# Patient Record
Sex: Female | Born: 1965 | Race: White | Hispanic: No | Smoking: Current every day smoker
Health system: Southern US, Community
[De-identification: ages and names within clinical notes are randomized; demographics above are authoritative.]

## PROBLEM LIST (undated history)

## (undated) DIAGNOSIS — I10 Essential (primary) hypertension: Secondary | ICD-10-CM

## (undated) DIAGNOSIS — K219 Gastro-esophageal reflux disease without esophagitis: Secondary | ICD-10-CM

## (undated) DIAGNOSIS — C801 Malignant (primary) neoplasm, unspecified: Secondary | ICD-10-CM

## (undated) DIAGNOSIS — F319 Bipolar disorder, unspecified: Secondary | ICD-10-CM

## (undated) HISTORY — PX: LASER ABLATION: SHX1947

## (undated) HISTORY — PX: TUBAL LIGATION: SHX77

---

## 2004-05-05 ENCOUNTER — Emergency Department (HOSPITAL_COMMUNITY): Admission: EM | Admit: 2004-05-05 | Discharge: 2004-05-05 | Payer: Self-pay | Admitting: Emergency Medicine

## 2007-06-18 ENCOUNTER — Ambulatory Visit: Payer: Self-pay | Admitting: Family Medicine

## 2007-06-18 DIAGNOSIS — F319 Bipolar disorder, unspecified: Secondary | ICD-10-CM

## 2007-06-18 DIAGNOSIS — G43909 Migraine, unspecified, not intractable, without status migrainosus: Secondary | ICD-10-CM | POA: Insufficient documentation

## 2007-06-18 DIAGNOSIS — T7491XA Unspecified adult maltreatment, confirmed, initial encounter: Secondary | ICD-10-CM | POA: Insufficient documentation

## 2007-06-18 DIAGNOSIS — I10 Essential (primary) hypertension: Secondary | ICD-10-CM | POA: Insufficient documentation

## 2007-06-18 DIAGNOSIS — F17200 Nicotine dependence, unspecified, uncomplicated: Secondary | ICD-10-CM

## 2007-06-18 DIAGNOSIS — N318 Other neuromuscular dysfunction of bladder: Secondary | ICD-10-CM

## 2007-06-18 LAB — CONVERTED CEMR LAB: Pap Smear: NORMAL

## 2007-07-14 ENCOUNTER — Encounter (INDEPENDENT_AMBULATORY_CARE_PROVIDER_SITE_OTHER): Payer: Self-pay | Admitting: Family Medicine

## 2007-07-29 ENCOUNTER — Encounter (INDEPENDENT_AMBULATORY_CARE_PROVIDER_SITE_OTHER): Payer: Self-pay | Admitting: Family Medicine

## 2007-07-30 LAB — CONVERTED CEMR LAB
ALT: 19 units/L (ref 0–35)
AST: 18 units/L (ref 0–37)
Albumin: 4.6 g/dL (ref 3.5–5.2)
Alkaline Phosphatase: 94 units/L (ref 39–117)
BUN: 13 mg/dL (ref 6–23)
Basophils Absolute: 0 10*3/uL (ref 0.0–0.1)
Basophils Relative: 0 % (ref 0–1)
CO2: 19 meq/L (ref 19–32)
Calcium: 8.8 mg/dL (ref 8.4–10.5)
Chloride: 106 meq/L (ref 96–112)
Cholesterol: 178 mg/dL (ref 0–200)
Creatinine, Ser: 0.88 mg/dL (ref 0.40–1.20)
Eosinophils Absolute: 0.1 10*3/uL (ref 0.0–0.7)
Eosinophils Relative: 1 % (ref 0–5)
Glucose, Bld: 91 mg/dL (ref 70–99)
HCT: 39.8 % (ref 36.0–46.0)
HDL: 58 mg/dL (ref >39)
Hemoglobin: 13.8 g/dL (ref 12.0–15.0)
LDL Cholesterol: 84 mg/dL (ref 0–99)
Lymphocytes Relative: 36 % (ref 12–46)
Lymphs Abs: 2.8 10*3/uL (ref 0.7–3.3)
MCHC: 34.7 g/dL (ref 30.0–36.0)
MCV: 89.6 fL (ref 78.0–100.0)
Monocytes Absolute: 0.7 10*3/uL (ref 0.2–0.7)
Monocytes Relative: 9 % (ref 3–11)
Neutro Abs: 4.3 10*3/uL (ref 1.7–7.7)
Neutrophils Relative %: 54 % (ref 43–77)
Platelets: 342 10*3/uL (ref 150–400)
Potassium: 4.3 meq/L (ref 3.5–5.3)
RBC: 4.44 M/uL (ref 3.87–5.11)
RDW: 13.5 % (ref 11.5–14.0)
Sodium: 139 meq/L (ref 135–145)
TSH: 1.664 microintl units/mL (ref 0.350–5.50)
Total Bilirubin: 0.6 mg/dL (ref 0.3–1.2)
Total CHOL/HDL Ratio: 3.1
Total Protein: 7.5 g/dL (ref 6.0–8.3)
Triglycerides: 179 mg/dL — ABNORMAL HIGH (ref <150)
VLDL: 36 mg/dL (ref 0–40)
WBC: 7.9 10*3/uL (ref 4.0–10.5)

## 2007-07-31 ENCOUNTER — Telehealth (INDEPENDENT_AMBULATORY_CARE_PROVIDER_SITE_OTHER): Payer: Self-pay | Admitting: *Deleted

## 2007-08-03 ENCOUNTER — Encounter (INDEPENDENT_AMBULATORY_CARE_PROVIDER_SITE_OTHER): Payer: Self-pay | Admitting: Family Medicine

## 2007-08-03 ENCOUNTER — Telehealth (INDEPENDENT_AMBULATORY_CARE_PROVIDER_SITE_OTHER): Payer: Self-pay | Admitting: *Deleted

## 2007-08-03 ENCOUNTER — Ambulatory Visit: Payer: Self-pay | Admitting: Family Medicine

## 2007-08-03 DIAGNOSIS — K219 Gastro-esophageal reflux disease without esophagitis: Secondary | ICD-10-CM

## 2007-08-04 ENCOUNTER — Other Ambulatory Visit: Admission: RE | Admit: 2007-08-04 | Discharge: 2007-08-04 | Payer: Self-pay | Admitting: Family Medicine

## 2007-08-04 ENCOUNTER — Telehealth (INDEPENDENT_AMBULATORY_CARE_PROVIDER_SITE_OTHER): Payer: Self-pay | Admitting: Family Medicine

## 2007-08-04 LAB — CONVERTED CEMR LAB: Prolactin: 4.2 ng/mL

## 2007-08-07 ENCOUNTER — Telehealth (INDEPENDENT_AMBULATORY_CARE_PROVIDER_SITE_OTHER): Payer: Self-pay | Admitting: *Deleted

## 2007-08-11 ENCOUNTER — Encounter (INDEPENDENT_AMBULATORY_CARE_PROVIDER_SITE_OTHER): Payer: Self-pay | Admitting: Family Medicine

## 2007-08-17 ENCOUNTER — Telehealth (INDEPENDENT_AMBULATORY_CARE_PROVIDER_SITE_OTHER): Payer: Self-pay | Admitting: *Deleted

## 2007-09-03 ENCOUNTER — Encounter (INDEPENDENT_AMBULATORY_CARE_PROVIDER_SITE_OTHER): Payer: Self-pay | Admitting: Family Medicine

## 2007-09-16 ENCOUNTER — Ambulatory Visit: Payer: Self-pay | Admitting: Family Medicine

## 2007-09-16 ENCOUNTER — Telehealth (INDEPENDENT_AMBULATORY_CARE_PROVIDER_SITE_OTHER): Payer: Self-pay | Admitting: *Deleted

## 2007-09-17 ENCOUNTER — Encounter (INDEPENDENT_AMBULATORY_CARE_PROVIDER_SITE_OTHER): Payer: Self-pay | Admitting: Family Medicine

## 2007-09-17 ENCOUNTER — Telehealth (INDEPENDENT_AMBULATORY_CARE_PROVIDER_SITE_OTHER): Payer: Self-pay | Admitting: *Deleted

## 2007-09-17 LAB — CONVERTED CEMR LAB
Amphetamine Screen, Ur: NEGATIVE
Barbiturate Quant, Ur: NEGATIVE
Benzodiazepines.: NEGATIVE
Cocaine Metabolites: NEGATIVE
Creatinine,U: 75.3 mg/dL
Marijuana Metabolite: NEGATIVE
Methadone: NEGATIVE
Opiate Screen, Urine: NEGATIVE
Phencyclidine (PCP): NEGATIVE
Propoxyphene: NEGATIVE

## 2007-11-26 ENCOUNTER — Telehealth (INDEPENDENT_AMBULATORY_CARE_PROVIDER_SITE_OTHER): Payer: Self-pay | Admitting: *Deleted

## 2007-11-26 ENCOUNTER — Ambulatory Visit: Payer: Self-pay | Admitting: Family Medicine

## 2008-03-14 ENCOUNTER — Encounter (INDEPENDENT_AMBULATORY_CARE_PROVIDER_SITE_OTHER): Payer: Self-pay | Admitting: Family Medicine

## 2008-05-18 ENCOUNTER — Ambulatory Visit: Payer: Self-pay | Admitting: Family Medicine

## 2008-05-18 LAB — CONVERTED CEMR LAB
Cholesterol, target level: 200 mg/dL
Glucose, Urine, Semiquant: NEGATIVE
HDL goal, serum: 40 mg/dL
Ketones, urine, test strip: NEGATIVE
LDL Goal: 130 mg/dL
Nitrite: NEGATIVE
Protein, U semiquant: 30
Specific Gravity, Urine: 1.025
Urobilinogen, UA: 0.2
pH: 5.5

## 2008-05-19 ENCOUNTER — Encounter (INDEPENDENT_AMBULATORY_CARE_PROVIDER_SITE_OTHER): Payer: Self-pay | Admitting: Family Medicine

## 2008-05-23 LAB — CONVERTED CEMR LAB
BUN: 14 mg/dL (ref 6–23)
Basophils Absolute: 0 10*3/uL (ref 0.0–0.1)
Basophils Relative: 0 % (ref 0–1)
CO2: 18 meq/L — ABNORMAL LOW (ref 19–32)
Calcium: 9.1 mg/dL (ref 8.4–10.5)
Chloride: 102 meq/L (ref 96–112)
Creatinine, Ser: 0.92 mg/dL (ref 0.40–1.20)
Eosinophils Absolute: 0.1 10*3/uL (ref 0.0–0.7)
Eosinophils Relative: 1 % (ref 0–5)
Glucose, Bld: 111 mg/dL — ABNORMAL HIGH (ref 70–99)
HCT: 41 % (ref 36.0–46.0)
Hemoglobin: 13.4 g/dL (ref 12.0–15.0)
Lymphocytes Relative: 17 % (ref 12–46)
Lymphs Abs: 1.5 10*3/uL (ref 0.7–4.0)
MCHC: 32.7 g/dL (ref 30.0–36.0)
MCV: 93.4 fL (ref 78.0–100.0)
Monocytes Absolute: 0.8 10*3/uL (ref 0.1–1.0)
Monocytes Relative: 8 % (ref 3–12)
Neutro Abs: 6.7 10*3/uL (ref 1.7–7.7)
Neutrophils Relative %: 74 % (ref 43–77)
Platelets: 425 10*3/uL — ABNORMAL HIGH (ref 150–400)
Potassium: 4.2 meq/L (ref 3.5–5.3)
RBC: 4.39 M/uL (ref 3.87–5.11)
RDW: 15.6 % — ABNORMAL HIGH (ref 11.5–15.5)
Sodium: 137 meq/L (ref 135–145)
WBC: 9.1 10*3/uL (ref 4.0–10.5)

## 2008-06-03 ENCOUNTER — Encounter (INDEPENDENT_AMBULATORY_CARE_PROVIDER_SITE_OTHER): Payer: Self-pay | Admitting: Family Medicine

## 2008-07-14 ENCOUNTER — Telehealth (INDEPENDENT_AMBULATORY_CARE_PROVIDER_SITE_OTHER): Payer: Self-pay | Admitting: *Deleted

## 2008-07-19 ENCOUNTER — Other Ambulatory Visit: Admission: RE | Admit: 2008-07-19 | Discharge: 2008-07-19 | Payer: Self-pay | Admitting: Obstetrics and Gynecology

## 2008-07-25 ENCOUNTER — Ambulatory Visit (HOSPITAL_COMMUNITY): Admission: RE | Admit: 2008-07-25 | Discharge: 2008-07-25 | Payer: Self-pay | Admitting: Obstetrics and Gynecology

## 2008-07-25 LAB — CONVERTED CEMR LAB: Pap Smear: NORMAL

## 2008-08-29 ENCOUNTER — Ambulatory Visit (HOSPITAL_COMMUNITY): Admission: RE | Admit: 2008-08-29 | Discharge: 2008-08-29 | Payer: Self-pay | Admitting: Obstetrics and Gynecology

## 2008-09-12 ENCOUNTER — Ambulatory Visit: Payer: Self-pay | Admitting: Family Medicine

## 2008-09-12 DIAGNOSIS — E785 Hyperlipidemia, unspecified: Secondary | ICD-10-CM

## 2008-09-12 LAB — CONVERTED CEMR LAB

## 2008-10-12 ENCOUNTER — Ambulatory Visit (HOSPITAL_COMMUNITY): Admission: RE | Admit: 2008-10-12 | Discharge: 2008-10-12 | Payer: Self-pay | Admitting: Obstetrics and Gynecology

## 2008-10-17 ENCOUNTER — Telehealth (INDEPENDENT_AMBULATORY_CARE_PROVIDER_SITE_OTHER): Payer: Self-pay | Admitting: *Deleted

## 2008-10-19 ENCOUNTER — Encounter (INDEPENDENT_AMBULATORY_CARE_PROVIDER_SITE_OTHER): Payer: Self-pay | Admitting: Family Medicine

## 2008-11-02 ENCOUNTER — Telehealth (INDEPENDENT_AMBULATORY_CARE_PROVIDER_SITE_OTHER): Payer: Self-pay | Admitting: *Deleted

## 2008-11-07 ENCOUNTER — Encounter (INDEPENDENT_AMBULATORY_CARE_PROVIDER_SITE_OTHER): Payer: Self-pay | Admitting: Family Medicine

## 2008-11-08 LAB — CONVERTED CEMR LAB
ALT: 19 units/L (ref 0–35)
AST: 20 units/L (ref 0–37)
Albumin: 4.4 g/dL (ref 3.5–5.2)
Alkaline Phosphatase: 95 units/L (ref 39–117)
BUN: 20 mg/dL (ref 6–23)
Basophils Absolute: 0 10*3/uL (ref 0.0–0.1)
Basophils Relative: 0 % (ref 0–1)
CO2: 18 meq/L — ABNORMAL LOW (ref 19–32)
Calcium: 9 mg/dL (ref 8.4–10.5)
Chloride: 108 meq/L (ref 96–112)
Cholesterol: 223 mg/dL — ABNORMAL HIGH (ref 0–200)
Creatinine, Ser: 0.98 mg/dL (ref 0.40–1.20)
Eosinophils Absolute: 0.1 10*3/uL (ref 0.0–0.7)
Eosinophils Relative: 2 % (ref 0–5)
Glucose, Bld: 94 mg/dL (ref 70–99)
HCT: 39.4 % (ref 36.0–46.0)
HDL: 64 mg/dL (ref >39)
Hemoglobin: 12.4 g/dL (ref 12.0–15.0)
LDL Cholesterol: 124 mg/dL — ABNORMAL HIGH (ref 0–99)
Lymphocytes Relative: 37 % (ref 12–46)
Lymphs Abs: 2.9 10*3/uL (ref 0.7–4.0)
MCHC: 31.5 g/dL (ref 30.0–36.0)
MCV: 92.1 fL (ref 78.0–100.0)
Monocytes Absolute: 0.7 10*3/uL (ref 0.1–1.0)
Monocytes Relative: 9 % (ref 3–12)
Neutro Abs: 4.1 10*3/uL (ref 1.7–7.7)
Neutrophils Relative %: 52 % (ref 43–77)
Platelets: 311 10*3/uL (ref 150–400)
Potassium: 4.2 meq/L (ref 3.5–5.3)
RBC: 4.28 M/uL (ref 3.87–5.11)
RDW: 13.4 % (ref 11.5–15.5)
Sodium: 140 meq/L (ref 135–145)
TSH: 2.546 microintl units/mL (ref 0.350–4.50)
Total Bilirubin: 0.2 mg/dL — ABNORMAL LOW (ref 0.3–1.2)
Total CHOL/HDL Ratio: 3.5
Total Protein: 7.5 g/dL (ref 6.0–8.3)
Triglycerides: 175 mg/dL — ABNORMAL HIGH (ref <150)
VLDL: 35 mg/dL (ref 0–40)
WBC: 7.9 10*3/uL (ref 4.0–10.5)

## 2008-11-11 ENCOUNTER — Telehealth (INDEPENDENT_AMBULATORY_CARE_PROVIDER_SITE_OTHER): Payer: Self-pay | Admitting: Family Medicine

## 2008-12-16 ENCOUNTER — Telehealth (INDEPENDENT_AMBULATORY_CARE_PROVIDER_SITE_OTHER): Payer: Self-pay | Admitting: *Deleted

## 2008-12-20 ENCOUNTER — Ambulatory Visit: Payer: Self-pay | Admitting: Family Medicine

## 2008-12-20 LAB — CONVERTED CEMR LAB
Bilirubin Urine: NEGATIVE
Blood in Urine, dipstick: NEGATIVE
Glucose, Urine, Semiquant: NEGATIVE
Ketones, urine, test strip: NEGATIVE
LDL Goal: 160 mg/dL
Nitrite: NEGATIVE
Protein, U semiquant: NEGATIVE
Specific Gravity, Urine: 1.015
Urobilinogen, UA: 0.2
pH: 7

## 2009-01-05 ENCOUNTER — Encounter (INDEPENDENT_AMBULATORY_CARE_PROVIDER_SITE_OTHER): Payer: Self-pay | Admitting: Family Medicine

## 2009-01-17 ENCOUNTER — Ambulatory Visit: Payer: Self-pay | Admitting: Family Medicine

## 2009-01-17 DIAGNOSIS — N809 Endometriosis, unspecified: Secondary | ICD-10-CM | POA: Insufficient documentation

## 2009-01-17 DIAGNOSIS — F909 Attention-deficit hyperactivity disorder, unspecified type: Secondary | ICD-10-CM | POA: Insufficient documentation

## 2009-01-18 ENCOUNTER — Telehealth (INDEPENDENT_AMBULATORY_CARE_PROVIDER_SITE_OTHER): Payer: Self-pay | Admitting: *Deleted

## 2009-01-25 ENCOUNTER — Telehealth (INDEPENDENT_AMBULATORY_CARE_PROVIDER_SITE_OTHER): Payer: Self-pay | Admitting: *Deleted

## 2009-02-15 ENCOUNTER — Encounter (INDEPENDENT_AMBULATORY_CARE_PROVIDER_SITE_OTHER): Payer: Self-pay | Admitting: Family Medicine

## 2009-02-17 ENCOUNTER — Encounter (INDEPENDENT_AMBULATORY_CARE_PROVIDER_SITE_OTHER): Payer: Self-pay | Admitting: Family Medicine

## 2009-02-23 ENCOUNTER — Encounter (INDEPENDENT_AMBULATORY_CARE_PROVIDER_SITE_OTHER): Payer: Self-pay | Admitting: Family Medicine

## 2010-05-21 ENCOUNTER — Emergency Department (HOSPITAL_COMMUNITY): Admission: EM | Admit: 2010-05-21 | Discharge: 2010-05-21 | Payer: Self-pay | Admitting: Emergency Medicine

## 2010-06-10 ENCOUNTER — Emergency Department (HOSPITAL_COMMUNITY): Admission: EM | Admit: 2010-06-10 | Discharge: 2010-06-10 | Payer: Self-pay | Admitting: Emergency Medicine

## 2010-10-25 ENCOUNTER — Emergency Department (HOSPITAL_COMMUNITY)
Admission: EM | Admit: 2010-10-25 | Discharge: 2010-10-25 | Payer: Self-pay | Source: Home / Self Care | Admitting: Emergency Medicine

## 2010-10-29 LAB — URINALYSIS, ROUTINE W REFLEX MICROSCOPIC
Bilirubin Urine: NEGATIVE
Hgb urine dipstick: NEGATIVE
Ketones, ur: NEGATIVE mg/dL
Nitrite: NEGATIVE
Protein, ur: NEGATIVE mg/dL
Specific Gravity, Urine: 1.025 (ref 1.005–1.030)
Urine Glucose, Fasting: NEGATIVE mg/dL
Urobilinogen, UA: 0.2 mg/dL (ref 0.0–1.0)
pH: 5.5 (ref 5.0–8.0)

## 2010-10-29 LAB — URINE CULTURE
Colony Count: >=100000
Culture  Setup Time: 201201121330

## 2010-10-29 LAB — URINE MICROSCOPIC-ADD ON

## 2010-11-04 ENCOUNTER — Encounter: Payer: Self-pay | Admitting: Family Medicine

## 2010-11-05 ENCOUNTER — Encounter: Payer: Self-pay | Admitting: Family Medicine

## 2010-12-28 LAB — URINALYSIS, ROUTINE W REFLEX MICROSCOPIC
Bilirubin Urine: NEGATIVE
Glucose, UA: NEGATIVE mg/dL
Ketones, ur: NEGATIVE mg/dL
Leukocytes, UA: NEGATIVE
Nitrite: NEGATIVE
Protein, ur: NEGATIVE mg/dL
Specific Gravity, Urine: 1.015 (ref 1.005–1.030)
Urobilinogen, UA: 0.2 mg/dL (ref 0.0–1.0)
pH: 8 (ref 5.0–8.0)

## 2010-12-28 LAB — URINE MICROSCOPIC-ADD ON

## 2010-12-28 LAB — POCT PREGNANCY, URINE: Preg Test, Ur: NEGATIVE

## 2011-01-28 ENCOUNTER — Emergency Department (HOSPITAL_COMMUNITY)
Admission: EM | Admit: 2011-01-28 | Discharge: 2011-01-28 | Discharge status: Against Medical Advice | Payer: Medicare Other | Attending: Emergency Medicine | Admitting: Emergency Medicine

## 2011-01-28 DIAGNOSIS — Y92009 Unspecified place in unspecified non-institutional (private) residence as the place of occurrence of the external cause: Secondary | ICD-10-CM | POA: Insufficient documentation

## 2011-01-28 DIAGNOSIS — S0993XA Unspecified injury of face, initial encounter: Secondary | ICD-10-CM | POA: Insufficient documentation

## 2011-01-28 DIAGNOSIS — S199XXA Unspecified injury of neck, initial encounter: Secondary | ICD-10-CM | POA: Insufficient documentation
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
## 2011-02-26 NOTE — Op Note (Signed)
Bridget Griffith, Bridget Griffith               ACCOUNT NO.:  [id number]   MEDICAL RECORD NO.:  [id number]          PATIENT TYPE:  AMB   LOCATION:  DAY                           FACILITY:  [facility]   PHYSICIAN:  Tilda Burrow, M.D. DATE OF BIRTH:  1965/12/01   DATE OF PROCEDURE:  10/12/2008  DATE OF DISCHARGE:                               OPERATIVE REPORT   PREOPERATIVE DIAGNOSES:  Menorrhagia, dysmenorrhea, and chronic pain.   POSTOPERATIVE DIAGNOSES:  Menorrhagia and dysmenorrhea.   PROCEDURE:  Hysteroscopy, dilation and curettage, and endometrial  ablation.   SURGEON:  Tilda Burrow, MD   ASSISTANT:  None.   ANESTHESIA:  General by __________ CRNA.   COMPLICATIONS:  None.   FINDINGS:  Uterus sounded to 8 cm, thin endometrium with atrophic  endometrial tissues status post menses.   DETAILS OF PROCEDURE:  The patient was taken to the operating room,  prepped and draped for vaginal procedure.  Cervix was grasped with  single-tooth tenaculum.  Uterus sounded in the anteflexed position to 8  cm, dilated up to 23-French allowing introduction of a rigid 30-degree  hysteroscope with photo documentation of both tubal ostia.  There was no  suspicion for perforation.  Uterine fundus was clear and you could  easily see the small capillaries diffusely scattered through the  endometrial tissue.  Curettage had removed scanty amounts of tissue.  The [facility] ThermaChoice III endometrial ablation device was started  and filled with 9 mL of D5W and the 8-minute thermal ablation sequence  completed successfully at 87 degrees centigrade.  All 9 mL of fluid was  recovered.  Marcaine 0.5% with epinephrine was injected x17 mL around the cervix and  the patient allowed to go to recovery room in stable condition.  Sponge  and needle counts were correct.  She will be discharged on Toradol in  addition to her other multiple medicines which were left unchanged.  Follow up in our in 2 weeks and p.r.n. with  Dr. Lodema Hong.      Tilda Burrow, M.D.  Electronically Signed     JVF/MEDQ  D:  10/12/2008  T:  10/13/2008  Job:  [id number]   cc:   Milus Mallick. Lodema Hong, M.D.  Fax: [phone]

## 2011-04-05 ENCOUNTER — Emergency Department (HOSPITAL_COMMUNITY): Payer: Medicare Other

## 2011-04-05 ENCOUNTER — Emergency Department (HOSPITAL_COMMUNITY)
Admission: EM | Admit: 2011-04-05 | Discharge: 2011-04-05 | Disposition: A | Discharge status: Home / Self Care | Payer: Medicare Other | Attending: Emergency Medicine | Admitting: Emergency Medicine

## 2011-04-05 DIAGNOSIS — K219 Gastro-esophageal reflux disease without esophagitis: Secondary | ICD-10-CM | POA: Insufficient documentation

## 2011-04-05 DIAGNOSIS — I10 Essential (primary) hypertension: Secondary | ICD-10-CM | POA: Insufficient documentation

## 2011-04-05 DIAGNOSIS — S20219A Contusion of unspecified front wall of thorax, initial encounter: Secondary | ICD-10-CM | POA: Insufficient documentation

## 2011-04-05 DIAGNOSIS — F988 Other specified behavioral and emotional disorders with onset usually occurring in childhood and adolescence: Secondary | ICD-10-CM | POA: Insufficient documentation

## 2011-04-05 DIAGNOSIS — F319 Bipolar disorder, unspecified: Secondary | ICD-10-CM | POA: Insufficient documentation

## 2011-04-05 DIAGNOSIS — W19XXXA Unspecified fall, initial encounter: Secondary | ICD-10-CM | POA: Insufficient documentation

## 2011-06-15 ENCOUNTER — Emergency Department (HOSPITAL_COMMUNITY)
Admission: EM | Admit: 2011-06-15 | Discharge: 2011-06-15 | Disposition: A | Discharge status: Home / Self Care | Payer: Medicare Other | Attending: Emergency Medicine | Admitting: Emergency Medicine

## 2011-06-15 ENCOUNTER — Encounter: Payer: Self-pay | Admitting: *Deleted

## 2011-06-15 DIAGNOSIS — F319 Bipolar disorder, unspecified: Secondary | ICD-10-CM | POA: Insufficient documentation

## 2011-06-15 DIAGNOSIS — I10 Essential (primary) hypertension: Secondary | ICD-10-CM | POA: Insufficient documentation

## 2011-06-15 DIAGNOSIS — H6690 Otitis media, unspecified, unspecified ear: Secondary | ICD-10-CM | POA: Insufficient documentation

## 2011-06-15 DIAGNOSIS — J029 Acute pharyngitis, unspecified: Secondary | ICD-10-CM | POA: Insufficient documentation

## 2011-06-15 DIAGNOSIS — Z87891 Personal history of nicotine dependence: Secondary | ICD-10-CM | POA: Insufficient documentation

## 2011-06-15 DIAGNOSIS — Z859 Personal history of malignant neoplasm, unspecified: Secondary | ICD-10-CM | POA: Insufficient documentation

## 2011-06-15 HISTORY — DX: Malignant (primary) neoplasm, unspecified: C80.1

## 2011-06-15 HISTORY — DX: Bipolar disorder, unspecified: F31.9

## 2011-06-15 HISTORY — DX: Essential (primary) hypertension: I10

## 2011-06-15 MED ORDER — AMOXICILLIN 500 MG PO CAPS: 500.0000 mg | ORAL_CAPSULE | Freq: Three times a day (TID) | ORAL | Status: AC

## 2011-06-15 MED ORDER — MAGIC MOUTHWASH: ORAL | Status: DC

## 2011-06-15 MED ORDER — IBUPROFEN 800 MG PO TABS: 800.0000 mg | ORAL_TABLET | Freq: Three times a day (TID) | ORAL | Status: AC

## 2011-06-15 NOTE — ED Provider Notes (Signed)
History     CSN: [id number] Arrival date & time: 06/15/2011  6:32 PM  Chief Complaint  Patient presents with  . Sore Throat   HPI Comments: Patient c/o sore throat for one week.  Pain with swallowing.  Developed pain to her left ear and down to the left jaw.  Continues to have pain with swallowing and pain to left jaw with chewing.  Describes a "fullness" sensation to the left ear.  She denies headaches, chest pain, neck pain or stiffness.    Patient is a 45 y.o. female presenting with pharyngitis. The history is provided by the patient.  Sore Throat This is a new problem. The current episode started in the past 7 days. The problem occurs constantly. The problem has been gradually worsening. Associated symptoms include chills, a fever, myalgias and a sore throat. Pertinent negatives include no abdominal pain, arthralgias, chest pain, congestion, coughing, headaches, joint swelling, neck pain, numbness, rash, swollen glands, vomiting or weakness. The symptoms are aggravated by swallowing and eating. She has tried oral narcotics for the symptoms. The treatment provided no relief.    Past Medical History  Diagnosis Date  . Bipolar 1 disorder   . Hypertension   . Cancer     Past Surgical History  Procedure Date  . Tubal ligation   . Laser ablation     to uterus    History reviewed. No pertinent family history.  History  Substance Use Topics  . Smoking status: Former Smoker    Types: Cigarettes  . Smokeless tobacco: Not on file  . Alcohol Use: No    OB History    Grav Para Term Preterm Abortions TAB SAB Ect Mult Living                  Review of Systems  Constitutional: Positive for fever and chills. Negative for activity change and appetite change.  HENT: Positive for ear pain, sore throat and sinus pressure. Negative for hearing loss, congestion, facial swelling, drooling, neck pain, neck stiffness and tinnitus.   Respiratory: Negative for cough.   Cardiovascular:  Negative for chest pain.  Gastrointestinal: Negative for vomiting and abdominal pain.  Musculoskeletal: Positive for myalgias. Negative for joint swelling and arthralgias.  Skin: Negative for rash.  Neurological: Negative for dizziness, facial asymmetry, weakness, numbness and headaches.  Hematological: Does not bruise/bleed easily.  All other systems reviewed and are negative.    Physical Exam  BP 133/79  Pulse 85  Temp(Src) 97.8 F (36.6 C) (Oral)  Resp 18  Ht 5\' 3"  (1.6 m)  Wt 125 lb (56.7 kg)  BMI 22.14 kg/m2  SpO2 100%  Physical Exam  Nursing note and vitals reviewed. Constitutional: She appears well-developed and well-nourished. No distress.  HENT:  Head: Normocephalic and atraumatic. No trismus in the jaw.  Right Ear: Tympanic membrane, external ear and ear canal normal.  Left Ear: Hearing and ear canal normal. There is swelling and tenderness. No mastoid tenderness. A middle ear effusion is present. No hemotympanum.  Mouth/Throat: Uvula is midline and mucous membranes are normal. No uvula swelling. No oropharyngeal exudate, posterior oropharyngeal edema or tonsillar abscesses.  Eyes: EOM are normal. Pupils are equal, round, and reactive to light.  Neck: Normal range of motion, full passive range of motion without pain and phonation normal. Neck supple. No spinous process tenderness and no muscular tenderness present. No Brudzinski's sign and no Kernig's sign noted. No thyromegaly present.  Cardiovascular: Normal rate, regular rhythm and normal heart sounds.  Lymphadenopathy:    She has no cervical adenopathy.    ED Course  Procedures  MDM   7:39 PM Patient is alert, smiling, NAD.  Non-toxic appearing.  No lymphadenopathy, trismus , exudates or peritonsillar swelling.  Mild swelling of the left TM.  I will begin abx treatment for otitis media.  Pt agrees to return if sx's worsen.  Patient / Family / Caregiver understand and agree with initial ED impression and plan  with expectations set for ED visit.   The patient appears reasonably screened and/or stabilized for discharge and I doubt any other medical condition or other [facility] requiring further screening, evaluation, or treatment in the ED at this time prior to discharge.       Tammy L. Trisha Mangle, [place] 06/15/11 2004

## 2011-06-15 NOTE — ED Notes (Signed)
Pt c/o sore throat, left ear pain and jaw pain. Pt alert and oriented x 3. Skin warm and dry. Color pink.

## 2011-06-15 NOTE — ED Notes (Signed)
Pt c/o sore throat since last Saturday. States that she started having pain in her left ear and jaw 3 days ago. Denies fever or cough.

## 2011-06-16 NOTE — ED Provider Notes (Signed)
Medical screening examination/treatment/procedure(s) were performed by non-physician practitioner and as supervising physician I was immediately available for consultation/collaboration.   Vida Roller, MD 06/16/11 [phone]

## 2011-07-11 ENCOUNTER — Inpatient Hospital Stay (HOSPITAL_COMMUNITY): Admit: 2011-07-11 | Payer: Self-pay | Admitting: General Surgery

## 2011-07-19 LAB — CBC
HCT: 36.4 % (ref 36.0–46.0)
MCHC: 33.7 g/dL (ref 30.0–36.0)
MCV: 92.3 fL (ref 78.0–100.0)
Platelets: 302 10*3/uL (ref 150–400)
RDW: 14.5 % (ref 11.5–15.5)

## 2011-07-19 LAB — HCG, QUANTITATIVE, PREGNANCY: hCG, Beta Chain, Quant, S: <2 m[IU]/mL (ref <5)

## 2011-10-17 ENCOUNTER — Emergency Department (HOSPITAL_COMMUNITY): Payer: Medicare Other

## 2011-10-17 ENCOUNTER — Ambulatory Visit (HOSPITAL_COMMUNITY): Payer: Medicare Other

## 2011-10-17 ENCOUNTER — Ambulatory Visit (HOSPITAL_COMMUNITY): Admission: RE | Admit: 2011-10-17 | Payer: Medicare Other | Source: Ambulatory Visit

## 2011-10-17 ENCOUNTER — Emergency Department (HOSPITAL_COMMUNITY)
Admission: EM | Admit: 2011-10-17 | Discharge: 2011-10-17 | Discharge status: Against Medical Advice | Payer: Medicare Other | Attending: Emergency Medicine | Admitting: Emergency Medicine

## 2011-10-17 ENCOUNTER — Encounter (HOSPITAL_COMMUNITY): Payer: Self-pay

## 2011-10-17 DIAGNOSIS — W108XXA Fall (on) (from) other stairs and steps, initial encounter: Secondary | ICD-10-CM | POA: Insufficient documentation

## 2011-10-17 DIAGNOSIS — R51 Headache: Secondary | ICD-10-CM | POA: Insufficient documentation

## 2011-10-17 DIAGNOSIS — M542 Cervicalgia: Secondary | ICD-10-CM | POA: Insufficient documentation

## 2011-10-17 DIAGNOSIS — K219 Gastro-esophageal reflux disease without esophagitis: Secondary | ICD-10-CM | POA: Insufficient documentation

## 2011-10-17 DIAGNOSIS — I10 Essential (primary) hypertension: Secondary | ICD-10-CM | POA: Insufficient documentation

## 2011-10-17 DIAGNOSIS — Z79899 Other long term (current) drug therapy: Secondary | ICD-10-CM | POA: Insufficient documentation

## 2011-10-17 DIAGNOSIS — F17200 Nicotine dependence, unspecified, uncomplicated: Secondary | ICD-10-CM | POA: Insufficient documentation

## 2011-10-17 DIAGNOSIS — R079 Chest pain, unspecified: Secondary | ICD-10-CM | POA: Insufficient documentation

## 2011-10-17 DIAGNOSIS — S20219A Contusion of unspecified front wall of thorax, initial encounter: Secondary | ICD-10-CM | POA: Insufficient documentation

## 2011-10-17 DIAGNOSIS — G8929 Other chronic pain: Secondary | ICD-10-CM | POA: Insufficient documentation

## 2011-10-17 DIAGNOSIS — F319 Bipolar disorder, unspecified: Secondary | ICD-10-CM | POA: Insufficient documentation

## 2011-10-17 DIAGNOSIS — W19XXXA Unspecified fall, initial encounter: Secondary | ICD-10-CM

## 2011-10-17 HISTORY — DX: Gastro-esophageal reflux disease without esophagitis: K21.9

## 2011-10-17 LAB — URINALYSIS, ROUTINE W REFLEX MICROSCOPIC
Bilirubin Urine: NEGATIVE
Hgb urine dipstick: NEGATIVE
Ketones, ur: NEGATIVE mg/dL
Nitrite: NEGATIVE
Urobilinogen, UA: 0.2 mg/dL (ref 0.0–1.0)

## 2011-10-17 MED ORDER — TRAMADOL-ACETAMINOPHEN 37.5-325 MG PO TABS: ORAL_TABLET | ORAL | Status: AC

## 2011-10-17 MED ORDER — PROMETHAZINE HCL 25 MG PO TABS: 25.0000 mg | ORAL_TABLET | Freq: Four times a day (QID) | ORAL | Status: AC | PRN

## 2011-10-17 MED ORDER — SODIUM CHLORIDE 0.9 % IV SOLN
INTRAVENOUS | Status: DC
  Administered 2011-10-17: 18:00:00 via INTRAVENOUS

## 2011-10-17 MED ORDER — DIPHENHYDRAMINE HCL 50 MG/ML IJ SOLN
50.0000 mg | Freq: Once | INTRAMUSCULAR | Status: AC
  Administered 2011-10-17: 50 mg via INTRAVENOUS
  Filled 2011-10-17: qty 1

## 2011-10-17 MED ORDER — SODIUM CHLORIDE 0.9 % IV BOLUS (SEPSIS): 1000.0000 mL | Freq: Once | INTRAVENOUS | Status: DC

## 2011-10-17 MED ORDER — KETOROLAC TROMETHAMINE 30 MG/ML IJ SOLN
30.0000 mg | Freq: Once | INTRAMUSCULAR | Status: AC
  Administered 2011-10-17: 30 mg via INTRAVENOUS
  Filled 2011-10-17: qty 1

## 2011-10-17 MED ORDER — METOCLOPRAMIDE HCL 5 MG/ML IJ SOLN
10.0000 mg | Freq: Once | INTRAMUSCULAR | Status: AC
  Administered 2011-10-17: 10 mg via INTRAVENOUS
  Filled 2011-10-17: qty 2

## 2011-10-17 NOTE — ED Notes (Signed)
Patient is not in room, told tech she was leaving because her pain was not relieved. Patient discontinued her own IV

## 2011-10-17 NOTE — ED Notes (Addendum)
Pt had said she could not afford "another head ct".  I told her that the MD thought she needed it since her fall.  And I thought she agreed.  When over in ct , pt refused the scan.  Dr Lynelle Doctor notified.

## 2011-10-17 NOTE — ED Provider Notes (Signed)
History  This chart was scribed for Ward Givens, MD by Bennett Scrape. This patient was seen in room [facility]11/[facility] and the patient's care was started at 5:04PM.  CSN: [id number]  Arrival date & time 10/17/11  1218   First MD Initiated Contact with Patient 10/17/11 1627      Chief Complaint  Patient presents with  . Headache  . Flank Pain   Patient is a 46 y.o. female presenting with headaches. The history is provided by the patient. No language interpreter was used.  Headache  This is a new problem. The current episode started more than 2 days ago. The problem occurs constantly. The problem has not changed since onset.The headache is associated with bright light and loud noise. The pain is located in the frontal region. The quality of the pain is described as throbbing. The pain does not radiate. Pertinent negatives include no fever, no chest pressure, no syncope, no shortness of breath, no nausea and no vomiting.    Bridget Griffith is a 46 y.o. female who presents to the Emergency Department complaining of 3 days of a constant, non-changing HA located along the frontal region, the top and back of her head with associated left lower rib cage and neck pain described as soreness. Pt states that the symptoms began after she fell on  New Year's down a flight of steps  and landed on her buttocks with her head hitting the steps on the posterior portion. Pt states that she had been drinking when the fall occurred. Pt reports that the pain is worse with deep breathes, bright light and loud noises.  Pt states that she took one phenergan today with no improvement in symptoms.  Pt has a h/o migraines which she states she gets once or twice a month and that the pain is similar to the pain experienced with previous migraines. 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  She denies numbness in her arms, fever, nausea, vomiting and diarrhea as  associated symptoms.  Pt states her doctor stopped her lortabs in Nov and is in process of being referred to a pain clinic. She states the tramadol isn't helping.    Pt also c/o a growth on her left kidney that was diagnosed through a MRI scan at [facility]. She is advised to f/u with her doctor to get further evaluation of that abnormality.She states that she has been using electronic cigarettes for 3 weeks.  Pt's PCP is Dr. Vilma Prader in [place].  Past Medical History  Diagnosis Date  . Bipolar 1 disorder   . Hypertension   . GERD (gastroesophageal reflux disease)   . 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 Tubal ligation   . Laser ablation     to uterus    No family history on file.  History  Substance Use Topics  . Smoking status: Former Smoker    Types: Cigarettes  . Smokeless tobacco: Not on file  . Alcohol Use: No  Pt is on disability for chronic back pain.  OB History    Grav Para Term Preterm Abortions TAB SAB Ect Mult Living                  Review of Systems  Constitutional: Negative for fever.  Respiratory: Negative for shortness of breath.   Cardiovascular:  Negative for syncope.  Gastrointestinal: Negative for nausea and vomiting.  Neurological: Positive for headaches.  All other systems reviewed and are negative.    Allergies  Sulfamethoxazole w/trimethoprim  Home Medications   Current Outpatient Rx  Name Route Sig Dispense Refill  . AMLODIPINE BESY-BENAZEPRIL HCL 10-20 MG PO CAPS Oral Take 1 capsule by mouth daily.      . AMPHETAMINE-DEXTROAMPHET ER 25 MG PO CP24 Oral Take 25 mg by mouth 2 (two) times daily.      Marland Kitchen CLONAZEPAM 0.5 MG PO TABS Oral Take 0.5 mg by mouth 4 (four) times daily.      Marland Kitchen GABAPENTIN 600 MG PO TABS Oral Take 1,200 mg by mouth 2 (two) times daily.      Marland Kitchen LATUDA PO Oral Take 1 tablet by mouth at bedtime.      . TRAMADOL HCL 50 MG PO TABS Oral Take 50 mg by mouth every 4 (four)  hours as needed. For pain     . TRAZODONE HCL 300 MG PO TABS Oral Take 300 mg by mouth at bedtime.        Triage Vitals: BP 138/85  Pulse 79  Temp(Src) 97.7 F (36.5 C) (Oral)  Resp 20  Ht 5\' 3"  (1.6 m)  Wt 128 lb (58.06 kg)  BMI 22.67 kg/m2  SpO2 100%  Vital signs normal    Physical Exam  Nursing note and vitals reviewed. Constitutional: She is oriented to person, place, and time. She appears well-developed and well-nourished.  Non-toxic appearance. She does not appear ill. No distress.  HENT:  Head: Normocephalic and atraumatic.  Right Ear: External ear normal.  Left Ear: External ear normal.  Nose: Nose normal. No mucosal edema or rhinorrhea.  Mouth/Throat: Oropharynx is clear and moist and mucous membranes are normal. No dental abscesses or uvula swelling.       She does not have any focal tenderness to palpation of her head  Eyes: Conjunctivae and EOM are normal. Pupils are equal, round, and reactive to light.  Neck: Normal range of motion and full passive range of motion without pain. Neck supple.       Patient moves head freely during conversation. She's also noted to look to her extreme right while talking to me while sitting on her left for prolonged period of time. She indicates she has some pain in the upper neck were it attaches to the base of the skull.  Cardiovascular: Normal rate, regular rhythm and normal heart sounds.  Exam reveals no gallop and no friction rub.   No murmur heard. Pulmonary/Chest: Effort normal and breath sounds normal. No respiratory distress. She has no wheezes. She has no rhonchi. She has no rales. She exhibits tenderness. She exhibits no crepitus.       Patient has tenderness along her lower left rib cage from the mid axillary line posteriorly. There's no bruising, crepitance, soft tissue swelling seen.  Abdominal: Soft. Normal appearance and bowel sounds are normal. She exhibits no distension. There is no tenderness. There is no rebound and no  guarding.  Musculoskeletal: Normal range of motion. She exhibits no edema and no tenderness.       Moves all extremities well.   Neurological: She is alert and oriented to person, place, and time. She has normal strength. No cranial nerve deficit.  Skin: Skin is warm, dry and intact. No rash noted. No erythema. No pallor.  Psychiatric: She has a normal mood and affect. Her speech is normal and behavior is  normal. Her mood appears not anxious.       Patient became tearful when discussing is waiting to be referred to pain management    ED Course  Procedures (including critical care time)  DIAGNOSTIC STUDIES: Oxygen Saturation is 100% on room air, normal by my interpretation.    COORDINATION OF CARE: 5:10PM-Discussed treatment plan with pt at bedside and pt agreed to plan. Pt went to CT then refused to have her CT done. 6:31PM-Pt states that she is still having HA symptoms and is requesting narcotics. I will not give her narcotics. Advised pt to see PCP for chronic pain issues. 18:54 FOP came to address me giving her narcotics for her pain, I reiterated to him that her own doctor stopped her narcotics and I would not be giving her any more. States they are ready to go.  1900 On way out the door patient states her doctor wanted to put her on something stronger than the lortabs, but couldn't (doesn't explain the tramadol she did put her on).   Results for orders placed during the hospital encounter of 10/17/11  URINALYSIS, ROUTINE W REFLEX MICROSCOPIC      Component Value Range   Color, Urine YELLOW  YELLOW    APPearance CLEAR  CLEAR    Specific Gravity, Urine <1.005 (*) 1.005 - 1.030    pH 6.0  5.0 - 8.0    Glucose, UA NEGATIVE  NEGATIVE (mg/dL)   Hgb urine dipstick NEGATIVE  NEGATIVE    Bilirubin Urine NEGATIVE  NEGATIVE    Ketones, ur NEGATIVE  NEGATIVE (mg/dL)   Protein, ur NEGATIVE  NEGATIVE (mg/dL)   Urobilinogen, UA 0.2  0.0 - 1.0 (mg/dL)   Nitrite NEGATIVE  NEGATIVE     Leukocytes, UA NEGATIVE  NEGATIVE      Dg Ribs Unilateral W/chest Left  10/17/2011  *RADIOLOGY REPORT*  Clinical Data: Larey Seat down stairs on New Year's Eve.  Left posterior rib pain.  Pain extends into the left flank.  LEFT RIBS AND CHEST - 3+ VIEW  Comparison: 04/05/2011  Findings: Oblique views are performed, showing old left rib fractures.  No evidence for acute rib fracture or pneumothorax. Cardiomediastinal silhouette is within normal limits.  The lungs are free of focal consolidations and pleural effusions.  Prominent nipple shadows.  IMPRESSION: Old left rib fractures.  No evidence for acute fracture or pneumothorax.  Original Report Authenticated By: Patterson Hammersmith, M.D.     Diagnoses that have been ruled out:  Diagnoses that are still under consideration:  Final diagnoses:  Fall  Headache  Contusion, chest wall  Chronic pain    New Prescriptions   PROMETHAZINE (PHENERGAN) 25 MG TABLET    Take 1 tablet (25 mg total) by mouth every 6 (six) hours as needed for nausea.   TRAMADOL-ACETAMINOPHEN (ULTRACET) 37.5-325 MG PER TABLET    2 tabs po QID prn pain   Plan discharge--pt left before getting discharge instructions.   MDM     I personally performed the services described in this documentation, which was scribed in my presence. The recorded information has been reviewed and considered.    Ward Givens, MD 10/17/11 1902

## 2011-10-17 NOTE — ED Notes (Signed)
Vs reassessed

## 2011-10-17 NOTE — ED Notes (Signed)
Pt s father to desk, says pt wants IV out and that he wants to talk to MD.

## 2011-10-17 NOTE — ED Notes (Signed)
Alert, NAD, c/o back pain, headache,  Says she fell down steps(20), 3 days ago.  No Loc after fall.  Has not been seen by MD since she fell.

## 2011-10-17 NOTE — ED Notes (Signed)
C/o migraine. C/o left flank pain as well. Pt was told she had a "growth on left kidney" . Pt ambulatory. Pt drinking in triage. Pt instructed nothing else by mouth.

## 2012-08-20 ENCOUNTER — Encounter: Payer: Self-pay | Admitting: Physical Medicine & Rehabilitation

## 2012-09-07 ENCOUNTER — Ambulatory Visit (HOSPITAL_BASED_OUTPATIENT_CLINIC_OR_DEPARTMENT_OTHER): Payer: Medicare Other | Admitting: Physical Medicine & Rehabilitation

## 2012-09-07 ENCOUNTER — Encounter: Payer: Self-pay | Admitting: Physical Medicine & Rehabilitation

## 2012-09-07 ENCOUNTER — Encounter: Payer: Medicare Other | Attending: Physical Medicine & Rehabilitation

## 2012-09-07 VITALS — BP 149/86 | HR 80 | Resp 16 | Ht 63.0 in | Wt 128.0 lb

## 2012-09-07 DIAGNOSIS — S139XXA Sprain of joints and ligaments of unspecified parts of neck, initial encounter: Secondary | ICD-10-CM | POA: Insufficient documentation

## 2012-09-07 DIAGNOSIS — M545 Low back pain, unspecified: Secondary | ICD-10-CM | POA: Insufficient documentation

## 2012-09-07 DIAGNOSIS — M542 Cervicalgia: Secondary | ICD-10-CM | POA: Insufficient documentation

## 2012-09-07 DIAGNOSIS — X58XXXA Exposure to other specified factors, initial encounter: Secondary | ICD-10-CM | POA: Insufficient documentation

## 2012-09-07 DIAGNOSIS — Z5181 Encounter for therapeutic drug level monitoring: Secondary | ICD-10-CM

## 2012-09-07 DIAGNOSIS — M62838 Other muscle spasm: Secondary | ICD-10-CM | POA: Insufficient documentation

## 2012-09-07 DIAGNOSIS — R209 Unspecified disturbances of skin sensation: Secondary | ICD-10-CM | POA: Insufficient documentation

## 2012-09-07 DIAGNOSIS — IMO0001 Reserved for inherently not codable concepts without codable children: Secondary | ICD-10-CM

## 2012-09-07 DIAGNOSIS — M549 Dorsalgia, unspecified: Secondary | ICD-10-CM

## 2012-09-07 MED ORDER — NAPROXEN 500 MG PO TABS: 500.0000 mg | ORAL_TABLET | Freq: Two times a day (BID) | ORAL | Status: AC

## 2012-09-07 MED ORDER — METHOCARBAMOL 500 MG PO TABS: 500.0000 mg | ORAL_TABLET | Freq: Three times a day (TID) | ORAL | Status: DC
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
## 2012-09-07 NOTE — Patient Instructions (Signed)
X-rays at [facility] Physical therapy at [facility]

## 2012-09-17 ENCOUNTER — Telehealth: Payer: Self-pay

## 2012-09-17 NOTE — Telephone Encounter (Signed)
Looked patient up in [place] data,looks like she does not have a prescription for Xanax, in that case she will be non-narcotic

## 2012-09-17 NOTE — Telephone Encounter (Signed)
Message copied by Judd Gaudier on Thu Sep 17, 2012 10:37 AM ------      Message from: Su Monks      Created: Thu Sep 17, 2012 10:34 AM       Can you please ask patient, why the Xanax showed up in her UDS, it is not listed in our list, and the number seems to be high.

## 2012-09-17 NOTE — Telephone Encounter (Signed)
Left message for patient to call office regarding inconsistent Urine drug screen.

## 2012-09-25 ENCOUNTER — Ambulatory Visit: Payer: Medicare Other | Admitting: Physical Medicine & Rehabilitation

## 2012-09-25 ENCOUNTER — Encounter: Payer: Medicare Other | Attending: Physical Medicine & Rehabilitation

## 2012-09-25 DIAGNOSIS — M62838 Other muscle spasm: Secondary | ICD-10-CM | POA: Insufficient documentation

## 2012-09-25 DIAGNOSIS — M545 Low back pain, unspecified: Secondary | ICD-10-CM | POA: Insufficient documentation

## 2012-09-25 DIAGNOSIS — R209 Unspecified disturbances of skin sensation: Secondary | ICD-10-CM | POA: Insufficient documentation

## 2012-09-25 DIAGNOSIS — M542 Cervicalgia: Secondary | ICD-10-CM | POA: Insufficient documentation

## 2012-09-25 DIAGNOSIS — X58XXXA Exposure to other specified factors, initial encounter: Secondary | ICD-10-CM | POA: Insufficient documentation

## 2012-09-25 DIAGNOSIS — S139XXA Sprain of joints and ligaments of unspecified parts of neck, initial encounter: Secondary | ICD-10-CM | POA: Insufficient documentation

## 2012-09-28 ENCOUNTER — Ambulatory Visit: Payer: Medicare Other | Admitting: Physical Medicine & Rehabilitation

## 2012-10-16 ENCOUNTER — Other Ambulatory Visit: Payer: Self-pay | Admitting: *Deleted

## 2013-01-08 IMAGING — CR DG LUMBAR SPINE COMPLETE 4+V
5 series · 5 of 5 positions shown · non-contrast
Comparison: None

CLINICAL DATA: Fell off horse, trouble with urination, pain

LUMBAR SPINE - COMPLETE 4+ VIEW

[view not recorded (1 of 5)]
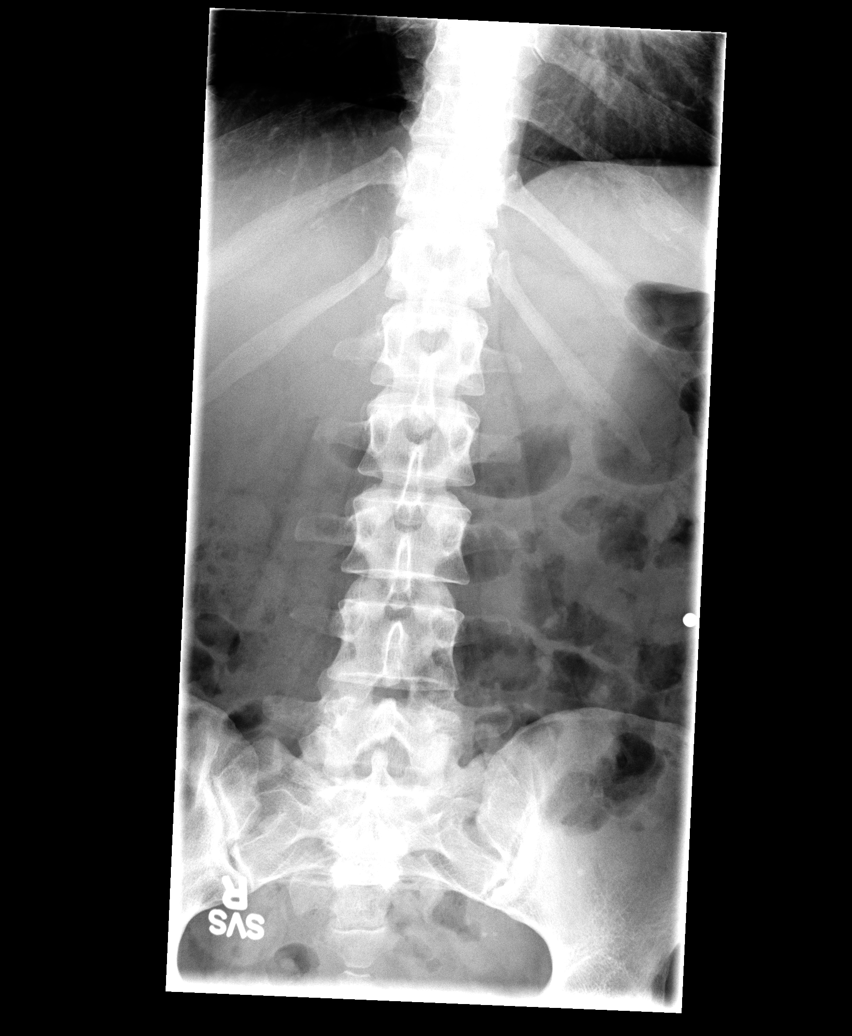

[view not recorded (2 of 5)]
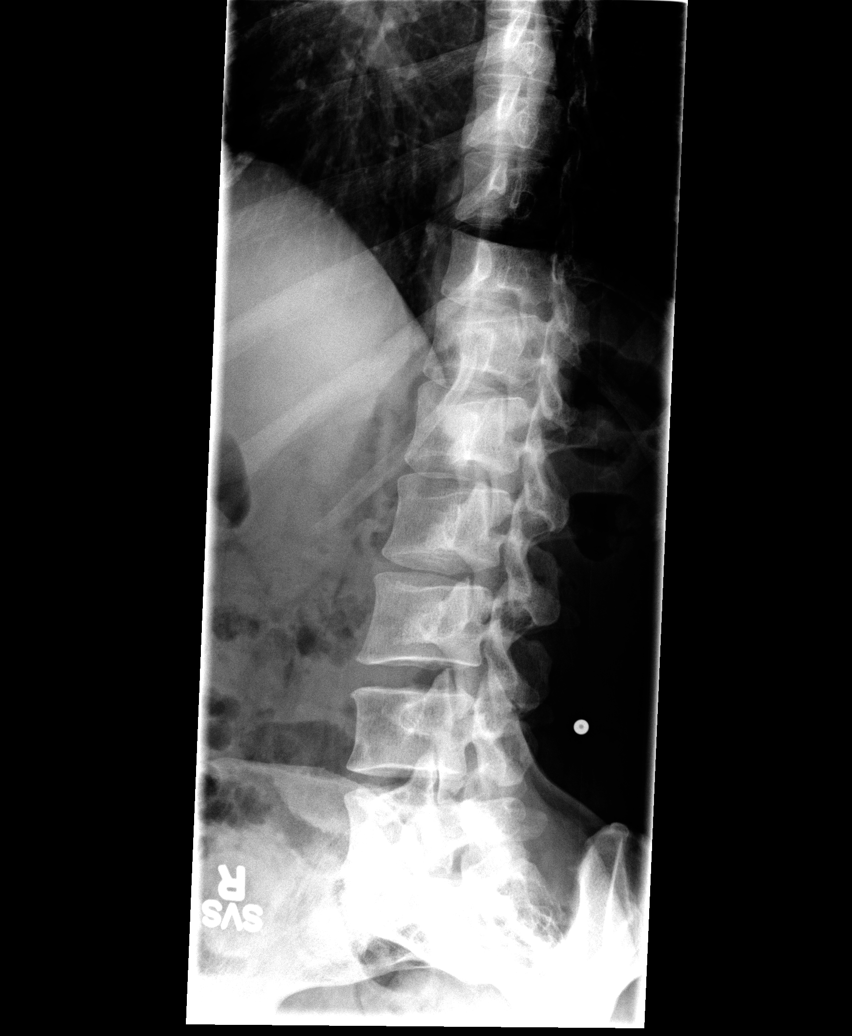

[view not recorded (3 of 5)]
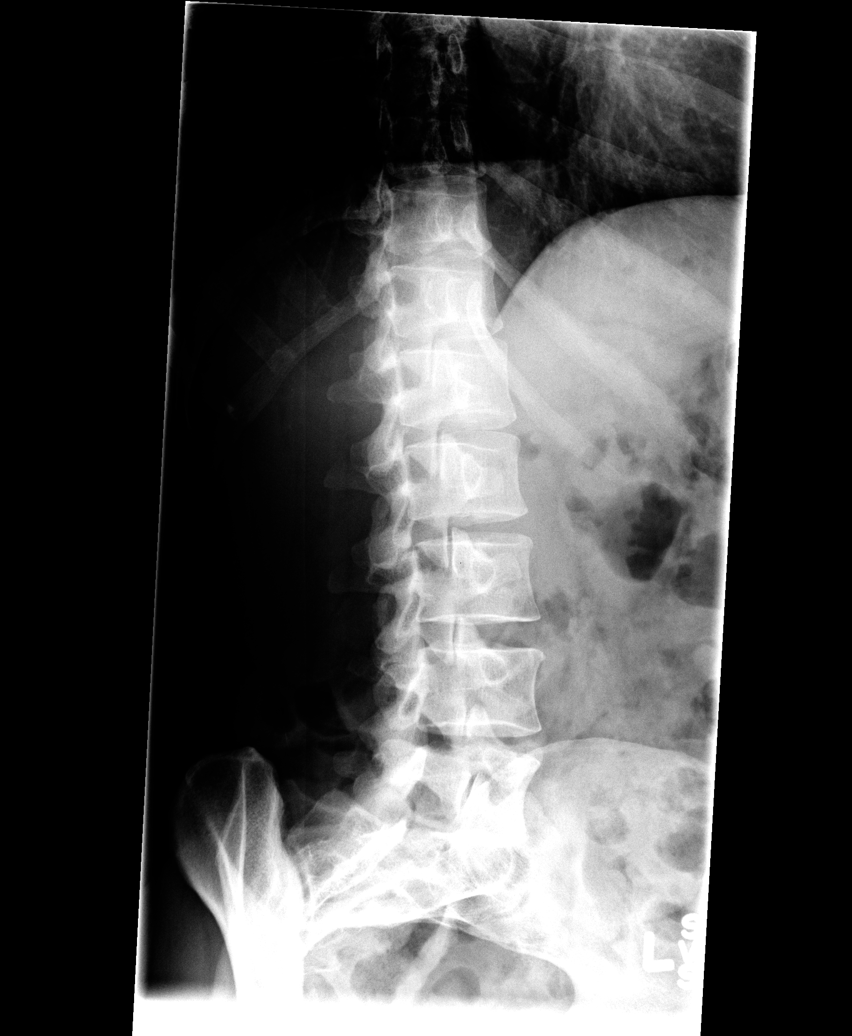

[view not recorded (4 of 5)]
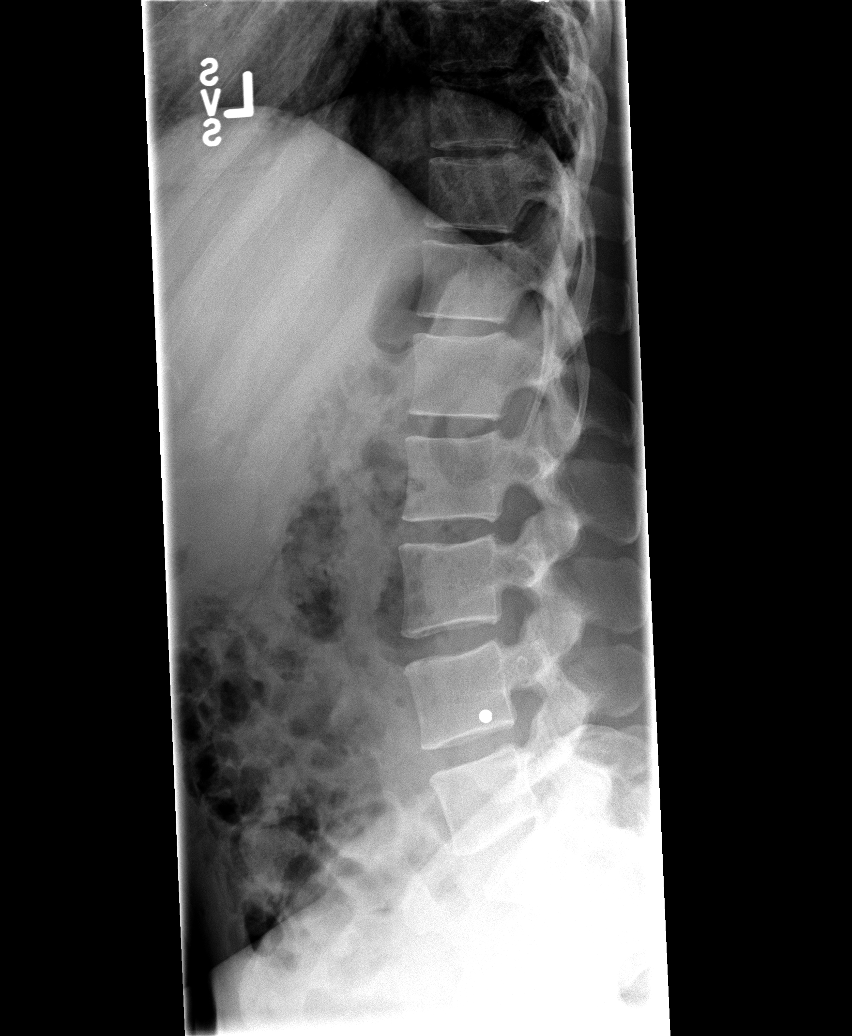

[view not recorded (5 of 5)]
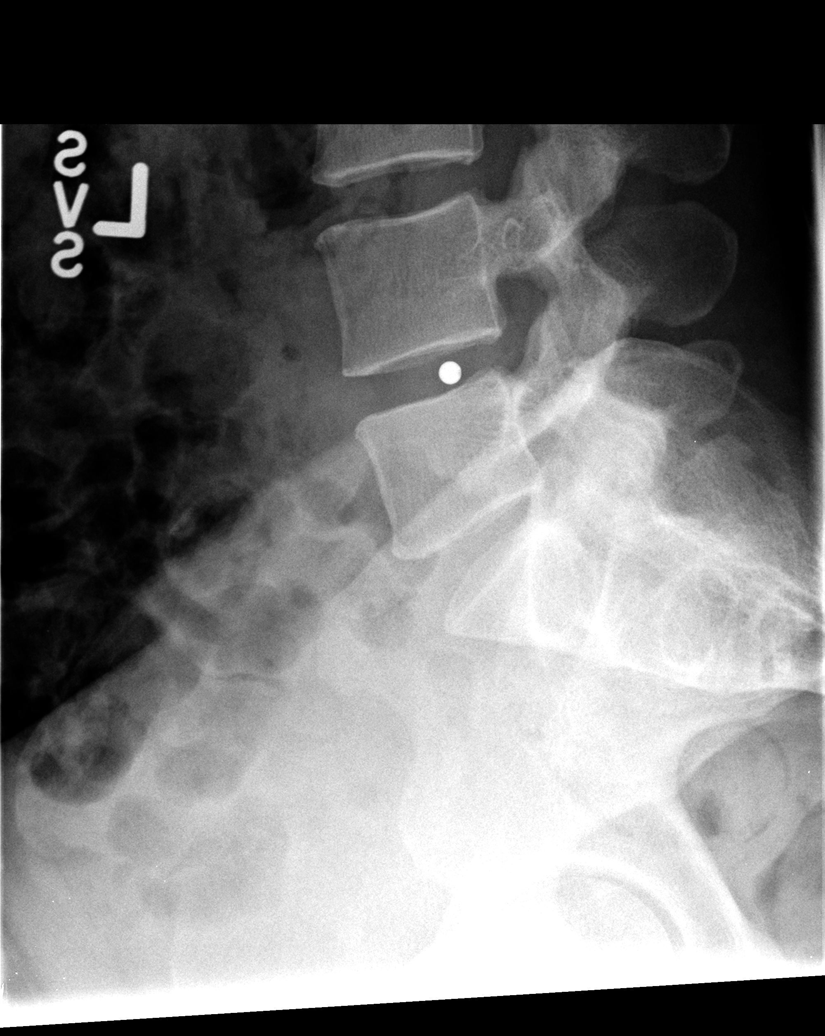

[5 of 5 positions shown; findings below may reference images not displayed]

FINDINGS: Five non-rib bearing lumbar vertebrae.
Bone mineralization normal.
SI joints symmetric and preserved.
Metallic foreign body lateral lower left abdomen posteriorly, at
level of superior endplate L4, question BB.
No acute fracture, subluxation, or bone destruction.
No spondylolysis.
IMPRESSION: No acute lumbar spine abnormalities.
BB at posterior left mid abdomen.

## 2014-06-02 ENCOUNTER — Ambulatory Visit (INDEPENDENT_AMBULATORY_CARE_PROVIDER_SITE_OTHER): Payer: Medicare Other

## 2014-06-02 ENCOUNTER — Ambulatory Visit (INDEPENDENT_AMBULATORY_CARE_PROVIDER_SITE_OTHER): Payer: Medicare Other | Admitting: Orthopedic Surgery

## 2014-06-02 VITALS — BP 160/79 | Ht 63.0 in | Wt 107.8 lb

## 2014-06-02 DIAGNOSIS — M79609 Pain in unspecified limb: Secondary | ICD-10-CM

## 2014-06-02 DIAGNOSIS — M79642 Pain in left hand: Secondary | ICD-10-CM

## 2014-06-02 DIAGNOSIS — M79641 Pain in right hand: Secondary | ICD-10-CM

## 2014-06-02 NOTE — Patient Instructions (Signed)
Splints for both hands We will refer you to a hand specialist and pain management
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
## 2014-06-06 ENCOUNTER — Other Ambulatory Visit: Payer: Self-pay | Admitting: *Deleted

## 2014-06-06 ENCOUNTER — Telehealth: Payer: Self-pay | Admitting: *Deleted

## 2014-06-06 DIAGNOSIS — G894 Chronic pain syndrome: Secondary | ICD-10-CM

## 2014-06-06 DIAGNOSIS — M19049 Primary osteoarthritis, unspecified hand: Secondary | ICD-10-CM

## 2014-06-06 NOTE — Telephone Encounter (Signed)
REFERRAL SENT TO DR Amedeo Plenty AT [facility]

## 2014-06-06 NOTE — Telephone Encounter (Signed)
REFERRAL SENT TO DR OTOOLE FOR PAIN MANAGEMENT

## 2014-06-27 NOTE — Telephone Encounter (Signed)
PATIENT HAS APPOINTMENT WITH DR [facility] 08/22/14 @ 2:30P

## 2014-06-29 NOTE — Telephone Encounter (Signed)
REFERRAL WAS NOT ACCEPTED AT [facility]IN [facility]NA[facility] DUE TO NON COMPLIANCE WITH OTHER DOCTORS IN THE PAST

## 2014-07-01 NOTE — Telephone Encounter (Signed)
SHE WILL HAVE TO FIND HER OWN

## 2014-07-15 NOTE — Telephone Encounter (Signed)
Patient aware.

## 2014-08-09 ENCOUNTER — Telehealth: Payer: Self-pay | Admitting: Orthopedic Surgery

## 2014-08-09 NOTE — Telephone Encounter (Signed)
FYI, SHE WAS REFERRED TO HAND SPECIALIST AND PAIN MANAGEMENT BUT PAIN MANAGEMENT DECLINED TO SEE HER DUE TO NON COMPLIANCE IN THE PAST

## 2014-08-09 NOTE — Telephone Encounter (Signed)
Routing to Dr Harrison 

## 2014-08-09 NOTE — Telephone Encounter (Signed)
Bridget Griffith is calling asking for something for pain for her thumbs and joints, she states its arthritis, please advise?

## 2014-08-09 NOTE — Telephone Encounter (Signed)
Nothing else i can do

## 2014-08-10 NOTE — Telephone Encounter (Signed)
Patient aware.

## 2014-12-16 ENCOUNTER — Other Ambulatory Visit (HOSPITAL_COMMUNITY): Payer: Self-pay | Admitting: Nurse Practitioner

## 2014-12-16 DIAGNOSIS — Z09 Encounter for follow-up examination after completed treatment for conditions other than malignant neoplasm: Secondary | ICD-10-CM

## 2014-12-16 DIAGNOSIS — N63 Unspecified lump in unspecified breast: Secondary | ICD-10-CM

## 2014-12-27 ENCOUNTER — Encounter (HOSPITAL_COMMUNITY): Payer: Medicare Other

## 2016-10-17 ENCOUNTER — Other Ambulatory Visit: Payer: Self-pay | Admitting: Neurology

## 2016-10-17 DIAGNOSIS — I6523 Occlusion and stenosis of bilateral carotid arteries: Secondary | ICD-10-CM

## 2016-10-17 DIAGNOSIS — I729 Aneurysm of unspecified site: Secondary | ICD-10-CM

## 2016-10-21 ENCOUNTER — Ambulatory Visit (HOSPITAL_COMMUNITY)
Admission: RE | Admit: 2016-10-21 | Discharge: 2016-10-21 | Disposition: A | Discharge status: Home / Self Care | Payer: Medicare PPO | Source: Ambulatory Visit | Attending: Neurology | Admitting: Neurology

## 2016-10-21 DIAGNOSIS — I6523 Occlusion and stenosis of bilateral carotid arteries: Secondary | ICD-10-CM

## 2016-10-21 DIAGNOSIS — Z8673 Personal history of transient ischemic attack (TIA), and cerebral infarction without residual deficits: Secondary | ICD-10-CM | POA: Insufficient documentation

## 2016-10-21 DIAGNOSIS — I729 Aneurysm of unspecified site: Secondary | ICD-10-CM

## 2016-10-21 MED ORDER — IOPAMIDOL (ISOVUE-370) INJECTION 76%
100.0000 mL | Freq: Once | INTRAVENOUS | Status: AC | PRN
  Administered 2016-10-21: 100 mL via INTRAVENOUS

## 2016-12-03 ENCOUNTER — Telehealth: Payer: Self-pay

## 2016-12-03 NOTE — Telephone Encounter (Signed)
SENT NOTES TO SCHEDULING 

## 2017-01-28 ENCOUNTER — Other Ambulatory Visit: Payer: Self-pay | Admitting: Neurology

## 2017-01-28 DIAGNOSIS — I639 Cerebral infarction, unspecified: Secondary | ICD-10-CM

## 2017-01-29 ENCOUNTER — Other Ambulatory Visit: Payer: Self-pay | Admitting: *Deleted

## 2017-01-29 ENCOUNTER — Telehealth: Payer: Self-pay | Admitting: *Deleted

## 2017-01-29 DIAGNOSIS — I498 Other specified cardiac arrhythmias: Secondary | ICD-10-CM

## 2017-01-29 NOTE — Telephone Encounter (Signed)
Called pt to inform of monitor and get address other than [address] for shipping. No answer at [phone], unable to leave msg.

## 2017-02-05 ENCOUNTER — Ambulatory Visit (HOSPITAL_COMMUNITY)
Admission: RE | Admit: 2017-02-05 | Discharge: 2017-02-05 | Disposition: A | Discharge status: Home / Self Care | Payer: Medicare PPO | Source: Ambulatory Visit | Attending: Neurology | Admitting: Neurology

## 2017-02-05 DIAGNOSIS — I6529 Occlusion and stenosis of unspecified carotid artery: Secondary | ICD-10-CM | POA: Insufficient documentation

## 2017-02-05 DIAGNOSIS — I10 Essential (primary) hypertension: Secondary | ICD-10-CM | POA: Diagnosis not present

## 2017-02-05 DIAGNOSIS — I340 Nonrheumatic mitral (valve) insufficiency: Secondary | ICD-10-CM | POA: Diagnosis not present

## 2017-02-05 DIAGNOSIS — I351 Nonrheumatic aortic (valve) insufficiency: Secondary | ICD-10-CM | POA: Diagnosis not present

## 2017-02-05 DIAGNOSIS — K219 Gastro-esophageal reflux disease without esophagitis: Secondary | ICD-10-CM | POA: Diagnosis not present

## 2017-02-05 DIAGNOSIS — I639 Cerebral infarction, unspecified: Secondary | ICD-10-CM | POA: Diagnosis not present

## 2017-02-05 DIAGNOSIS — E785 Hyperlipidemia, unspecified: Secondary | ICD-10-CM | POA: Diagnosis not present

## 2017-02-05 DIAGNOSIS — Z720 Tobacco use: Secondary | ICD-10-CM | POA: Diagnosis not present

## 2017-02-05 LAB — ECHOCARDIOGRAM COMPLETE
EERAT: 8.87
EWDT: 190 ms
FS: 32 % (ref 28–44)
IVS/LV PW RATIO, ED: 1.02
LA ID, A-P, ES: 35 mm
LA diam end sys: 35 mm
LA vol A4C: 46.8 ml
LA vol index: 30.3 mL/m2
LA vol: 44.5 mL
LADIAMINDEX: 2.38 cm/m2
LV PW d: 8.6 mm — AB (ref 0.6–1.1)
LV SIMPSON'S DISK: 67
LV TDI E'MEDIAL: 9.57
LV dias vol index: 52 mL/m2
LV dias vol: 77 mL (ref 46–106)
LVEEAVG: 8.87
LVEEMED: 8.87
LVELAT: 11.1 cm/s
LVOT SV: 51 mL
LVOT VTI: 22.5 cm
LVOT area: 2.27 cm2
LVOT diameter: 17 mm
LVOT peak grad rest: 4 mmHg
LVOTPV: 103 cm/s
LVSYSVOL: 25 mL (ref 14–42)
LVSYSVOLIN: 17 mL/m2
MV Dec: 190
MV pk A vel: 81.5 m/s
MVPG: 4 mmHg
MVPKEVEL: 98.5 m/s
P 1/2 time: 467 ms
RV LATERAL S' VELOCITY: 10.8 cm/s
RV TAPSE: 16 mm
RV sys press: 25 mmHg
Reg peak vel: 237 cm/s
Stroke v: 52 ml
TDI e' lateral: 11.1
TR max vel: 237 cm/s

## 2017-02-05 NOTE — Progress Notes (Signed)
*  PRELIMINARY RESULTS* Echocardiogram 2D Echocardiogram has been performed.  Bridget Griffith 02/05/2017, 3:41 PM

## 2017-02-07 ENCOUNTER — Ambulatory Visit (INDEPENDENT_AMBULATORY_CARE_PROVIDER_SITE_OTHER): Payer: Medicare PPO

## 2017-02-07 DIAGNOSIS — I498 Other specified cardiac arrhythmias: Secondary | ICD-10-CM

## 2020-05-09 ENCOUNTER — Emergency Department (HOSPITAL_COMMUNITY)
Admission: EM | Admit: 2020-05-09 | Discharge: 2020-05-09 | Disposition: A | Discharge status: Home / Self Care | Payer: Medicare Other

## 2020-05-09 ENCOUNTER — Other Ambulatory Visit: Payer: Self-pay

## 2020-05-09 NOTE — ED Notes (Signed)
Not in waiting area when called for triage

## 2020-05-11 ENCOUNTER — Ambulatory Visit: Payer: Medicare Other | Admitting: Internal Medicine

## 2020-05-26 ENCOUNTER — Other Ambulatory Visit: Payer: Self-pay

## 2020-05-26 ENCOUNTER — Other Ambulatory Visit: Payer: Self-pay | Admitting: Internal Medicine

## 2020-05-26 ENCOUNTER — Encounter: Payer: Self-pay | Admitting: Internal Medicine

## 2020-05-26 ENCOUNTER — Ambulatory Visit (INDEPENDENT_AMBULATORY_CARE_PROVIDER_SITE_OTHER): Payer: Medicare Other | Admitting: Internal Medicine

## 2020-05-26 VITALS — BP 144/83 | HR 94 | Temp 98.3°F | Ht 63.0 in | Wt 98.0 lb

## 2020-05-26 DIAGNOSIS — F901 Attention-deficit hyperactivity disorder, predominantly hyperactive type: Secondary | ICD-10-CM

## 2020-05-26 DIAGNOSIS — Z7689 Persons encountering health services in other specified circumstances: Secondary | ICD-10-CM | POA: Diagnosis not present

## 2020-05-26 DIAGNOSIS — F319 Bipolar disorder, unspecified: Secondary | ICD-10-CM

## 2020-05-26 DIAGNOSIS — M542 Cervicalgia: Secondary | ICD-10-CM | POA: Diagnosis not present

## 2020-05-26 DIAGNOSIS — H9211 Otorrhea, right ear: Secondary | ICD-10-CM

## 2020-05-26 DIAGNOSIS — I10 Essential (primary) hypertension: Secondary | ICD-10-CM

## 2020-05-26 DIAGNOSIS — Z23 Encounter for immunization: Secondary | ICD-10-CM

## 2020-05-26 DIAGNOSIS — G894 Chronic pain syndrome: Secondary | ICD-10-CM | POA: Diagnosis not present

## 2020-05-26 DIAGNOSIS — F17200 Nicotine dependence, unspecified, uncomplicated: Secondary | ICD-10-CM

## 2020-05-26 DIAGNOSIS — G8929 Other chronic pain: Secondary | ICD-10-CM | POA: Insufficient documentation

## 2020-05-26 DIAGNOSIS — F418 Other specified anxiety disorders: Secondary | ICD-10-CM

## 2020-05-26 MED ORDER — NICOTINE 14 MG/24HR TD PT24: 14.0000 mg | MEDICATED_PATCH | Freq: Every day | TRANSDERMAL | 0 refills | Status: AC

## 2020-05-26 MED ORDER — AMLODIPINE BESY-BENAZEPRIL HCL 10-20 MG PO CAPS: 1.0000 | ORAL_CAPSULE | Freq: Every day | ORAL | 1 refills | Status: AC

## 2020-05-26 NOTE — Addendum Note (Signed)
Addended by: Saintclair Halsted A on: 05/26/2020 12:02 PM   Modules accepted: Orders

## 2020-05-26 NOTE — Patient Instructions (Addendum)
Check CBC, CMP, Acute hepatitis panel,  A1c, Lipid panel, UA today, HIV, RF, vitamin B & D level Give shingrex today Follow up in 3 months Will discuss about colonoscopy & Mammogram on next visit Pap smear next visit

## 2020-05-26 NOTE — Progress Notes (Signed)
New Patient Office Visit  Subjective:  Patient ID: Bridget Griffith, female    DOB: 12-Feb-1966  Age: 54 y.o. MRN: [id number]  CC:  Chief Complaint  Patient presents with  . Hypertension  . Medication Refill  . asking for something for Rheumatoid Arthritis  . Hepatitis C  . wants to stop smoking    HPI Bridget Griffith is a very pleasant 54 year old female with past medical history of bipolar disorder, depression with anxiety, ADHD, chronic hepatitis C, tobacco abuse, hypertension, GERD, s/p endometrial ablation presents in our clinic for the first time for establishment of care and discuss about her chronic medical issues.  She tells me that she has history of hypertension however has not been taking her antihypertensive medication since many months.  She takes amlodipine-benazepril and requested for refills.    Patient tells me that she has ADHD, bipolar disorder, depression with anxiety for which she is followed by her psychiatrist and has an appointment on August 21.  She takes trazodone, Klonopin, Adderall.  She denies low mood, suicidal or homicidal thoughts.  She has chronic back and neck pain for which she is followed by pain management and takes oxycodone, naproxen, Robaxin, gabapentin.  She has an appointment with her pain management physician on October 7.  Reports right ear drainage with greenish discharge since 1 month associated with mild pain and decreased hearing from right ear.  She was seen by her previous PCP and was prescribed amoxicillin with no help.  She denies fever, chills, cough, congestion, bleeding from ear, sore throat, nasal breath or wheezing.  She tells me that she has pain in her hand joints and she is concerned about rheumatoid arthritis and wanted to be checked for rheumatoid arthritis.  She is not sexually active, she had endometrial ablation and when she was 54 year old due to history of cancer cells in her uterus?.  She tells me she had not had  menstrual cycle since then.  She is due for Pap smear, mammogram and colonoscopy.  She requested for Shingrix vaccine today.  She smokes half pack of cigarettes per day and requested for nicotine patch which help with the tobacco cessation.  Denies alcohol, illicit drug use. Past Medical History:  Diagnosis Date  . Bipolar 1 disorder ([place])   . 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 GERD (gastroesophageal reflux disease)   . Hypertension     Past Surgical History:  Procedure Laterality Date  . LASER ABLATION     to uterus  . TUBAL LIGATION      Family History  Problem Relation Age of Onset  . Cancer Mother   . Diabetes Mother   . Hypertension Mother   . ADD / ADHD Brother   . ADD / ADHD Daughter   . ADD / ADHD Son     Social History   Socioeconomic History  . Marital status: Divorced    Spouse name: Not on file  . Number of children: Not on file  . Years of education: Not on file  . Highest education level: Not on file  Occupational History  . Not on file  Tobacco Use  . Smoking status: Current Every Day Smoker    Packs/day: 0.50    Types: Cigarettes  . Smokeless tobacco: Never Used  Substance and Sexual Activity  . Alcohol use: No  . Drug use: No  . Sexual activity: Not Currently  Other Topics Concern  . Not on file  Social History Narrative  . Not on file   Social Determinants of Health   Financial Resource Strain:   . Difficulty of Paying Living Expenses:   Food Insecurity:   . Worried About Charity fundraiser in the Last Year:   . Arboriculturist in the Last Year:   Transportation Needs:   . Film/video editor (Medical):   Marland Kitchen Lack of Transportation (Non-Medical):   Physical Activity:   . Days of Exercise per Week:   . Minutes of Exercise per Session:   Stress:   . Feeling of Stress :   Social Connections:   . Frequency of Communication with Friends and Family:   . Frequency of Social Gatherings with Friends and Family:   . Attends  Religious Services:   . Active Member of Clubs or Organizations:   . Attends Archivist Meetings:   Marland Kitchen Marital Status:   Intimate Partner Violence:   . Fear of Current or Ex-Partner:   . Emotionally Abused:   Marland Kitchen Physically Abused:   . Sexually Abused:     ROS Review of Systems  Constitutional: Negative.   HENT: Positive for ear discharge and ear pain.   Eyes: Negative.   Respiratory: Negative.   Cardiovascular: Negative.   Gastrointestinal: Negative.   Endocrine: Negative.   Genitourinary: Negative.   Musculoskeletal: Positive for arthralgias, back pain and neck pain.  Allergic/Immunologic: Negative.   Neurological: Negative.   Hematological: Negative.   Psychiatric/Behavioral: The patient is nervous/anxious and is hyperactive.     Objective:   Today's Vitals: BP (!) 144/83 (BP Location: Left Arm, Patient Position: Sitting, Cuff Size: Normal)   Pulse 94   Temp 98.3 F (36.8 C) (Oral)   Ht 5\' 3"  (1.6 m)   Wt 98 lb 0.6 oz (44.5 kg)   SpO2 98%   BMI 17.37 kg/m   Physical Exam Constitutional:      Comments: Thin & lean  HENT:     Left Ear: Tympanic membrane, ear canal and external ear normal.     Ears:     Comments: Right ear: Tympanic membrane: Not visualized, clear discharge seen in right ear.  No bleeding or pus noted.    Mouth/Throat:     Mouth: Mucous membranes are moist.  Eyes:     Extraocular Movements: Extraocular movements intact.     Conjunctiva/sclera: Conjunctivae normal.     Pupils: Pupils are equal, round, and reactive to light.  Cardiovascular:     Rate and Rhythm: Normal rate and regular rhythm.     Pulses: Normal pulses.     Heart sounds: Normal heart sounds.  Pulmonary:     Effort: Pulmonary effort is normal.     Breath sounds: Normal breath sounds.  Abdominal:     General: Abdomen is flat. Bowel sounds are normal.     Palpations: Abdomen is soft.  Musculoskeletal:        General: Swelling and deformity present.  Neurological:      General: No focal deficit present.     Mental Status: She is alert and oriented to person, place, and time.  Psychiatric:        Mood and Affect: Mood normal.     Assessment & Plan:   Problem List Items Addressed This Visit      Other   DISORDER, BIPOLAR NOS   Relevant Orders   CBC   Comprehensive metabolic panel   Hepatitis panel, acute   HgB A1c  Lipid Profile   Urinalysis   HIV antibody (with reflex)   Rheumatoid Factor   B12   Vitamin D (25 hydroxy)   TOBACCO ABUSE   Relevant Orders   CBC   Comprehensive metabolic panel   Hepatitis panel, acute   HgB A1c   Lipid Profile   Urinalysis   HIV antibody (with reflex)   Rheumatoid Factor   B12   Vitamin D (25 hydroxy)   Chronic pain   Relevant Medications   oxycodone (ROXICODONE) 30 MG immediate release tablet   cyclobenzaprine (FLEXERIL) 10 MG tablet   Other Relevant Orders   CBC   Comprehensive metabolic panel   Hepatitis panel, acute   HgB A1c   Lipid Profile   Urinalysis   HIV antibody (with reflex)   Rheumatoid Factor   B12   Vitamin D (25 hydroxy)    Other Visit Diagnoses    Encounter to establish care    -  Primary   Relevant Orders   CBC   Comprehensive metabolic panel   Hepatitis panel, acute   HgB A1c   Lipid Profile   Urinalysis   HIV antibody (with reflex)   Rheumatoid Factor   B12   Vitamin D (25 hydroxy)   Essential hypertension       Relevant Medications   cloNIDine (CATAPRES) 0.1 MG tablet   amLODipine-benazepril (LOTREL) 10-20 MG capsule   Other Relevant Orders   CBC   Comprehensive metabolic panel   Hepatitis panel, acute   HgB A1c   Lipid Profile   Urinalysis   HIV antibody (with reflex)   Rheumatoid Factor   B12   Vitamin D (25 hydroxy)   Neck pain       Chronic pain disorder       Relevant Medications   oxycodone (ROXICODONE) 30 MG immediate release tablet   cyclobenzaprine (FLEXERIL) 10 MG tablet   Other Relevant Orders   CBC   Comprehensive metabolic panel    Hepatitis panel, acute   HgB A1c   Lipid Profile   Urinalysis   HIV antibody (with reflex)   Rheumatoid Factor   B12   Vitamin D (25 hydroxy)   Attention deficit hyperactivity disorder (ADHD), predominantly hyperactive type       Relevant Orders   CBC   Comprehensive metabolic panel   Hepatitis panel, acute   HgB A1c   Lipid Profile   Urinalysis   HIV antibody (with reflex)   Rheumatoid Factor   B12   Vitamin D (25 hydroxy)   Anxiety with depression       Relevant Orders   CBC   Comprehensive metabolic panel   Hepatitis panel, acute   HgB A1c   Lipid Profile   Urinalysis   HIV antibody (with reflex)   Rheumatoid Factor   B12   Vitamin D (25 hydroxy)   Drainage from right ear       Relevant Orders   Ambulatory referral to ENT   CBC   Comprehensive metabolic panel   Hepatitis panel, acute   HgB A1c   Lipid Profile   Urinalysis   HIV antibody (with reflex)   Rheumatoid Factor   B12   Vitamin D (25 hydroxy)   Need for shingles vaccine         Encounter to establish care: Care established.  Hypertension: Blood pressure 144/83.  -Likely secondary to noncompliance with medication.  She takes amlodipine-benazepril and requested for refills.  Refills given.  Advised to check  blood pressure every day and bring log on next visit.  Advised DASH diet. -Check CBC, CMP, lipid panel, UA, A1c  Right ear drainage: -Reports worsening symptoms associated with decreased hearing from right ear.  Refer patient to ENT for further management  Chronic neck and back pain: Followed by pain management.  Has a scheduled appointment on October 7.  Continue oxycodone, tramadol, gabapentin and Robaxin  Pain and swelling in hand joints: Concerned about rheumatoid arthritis. -Check rheumatoid factor.  Bipolar disorder/anxiety with depression: Continue Latuda, trazodone, Klonopin.  Patient denies suicidal or homicidal thoughts.  Followed by psychiatrist outpatient.  Has an appointment on  August 21.  Tobacco abuse: Asked:confirms currently smokes cigarettes Assess: Unwilling to quit but cutting back Advise: needs to QUIT to reduce risk of cancer, cardio and cerebrovascular disease Assist: counseled for 5 minutes and literature provided Arrange: follow up in 3 months -Prescribed nicotine patch as per request  Need for shingles vaccine: Vaccine given  ADHD: Continue Adderall.  Followed by psychiatrist.  History of hepatitis C: Check acute hepatitis panel  History of CKD: Check CMP  Follow-up in 3 months.  We will do Pap smear on next visit.  We will talk about mammogram and colonoscopy on next visit.  I advised patient to bring all medications on next visit and she verbalized understanding.  Outpatient Encounter Medications as of 05/26/2020  Medication Sig  . amphetamine-dextroamphetamine (ADDERALL XR) 25 MG 24 hr capsule Take 25 mg by mouth 2 (two) times daily.    . clonazePAM (KLONOPIN) 0.5 MG tablet Take 0.5 mg by mouth 4 (four) times daily.    . cloNIDine (CATAPRES) 0.1 MG tablet Take 0.1 mg by mouth 2 (two) times daily.  . cyclobenzaprine (FLEXERIL) 10 MG tablet Take 10 mg by mouth 3 (three) times daily as needed for muscle spasms.  Marland Kitchen gabapentin (NEURONTIN) 600 MG tablet Take 1,200 mg by mouth 2 (two) times daily.    . Lurasidone HCl (LATUDA) 120 MG TABS Take 160 mg by mouth daily.  . naproxen (NAPROSYN) 500 MG tablet Take 1 tablet (500 mg total) by mouth 2 (two) times daily with a meal.  . oxycodone (ROXICODONE) 30 MG immediate release tablet Take 10 mg by mouth 3 (three) times daily.  . trazodone (DESYREL) 300 MG tablet Take 300 mg by mouth at bedtime.    Marland Kitchen zolpidem (AMBIEN) 5 MG tablet Take 5 mg by mouth at bedtime as needed for sleep.  Marland Kitchen amLODipine-benazepril (LOTREL) 10-20 MG capsule Take 1 capsule by mouth daily.  . nicotine (NICODERM CQ - DOSED IN MG/24 HOURS) 14 mg/24hr patch Place 1 patch (14 mg total) onto the skin daily.  . [DISCONTINUED]  amLODipine-benazepril (LOTREL) 10-20 MG per capsule Take 1 capsule by mouth daily.   (Patient not taking: Reported on 05/26/2020)  . [DISCONTINUED] Asenapine Maleate (SAPHRIS) 10 MG SUBL Place 10 mg under the tongue at bedtime. (Patient not taking: Reported on 05/26/2020)  . [DISCONTINUED] methocarbamol (ROBAXIN) 500 MG tablet Take 1 tablet (500 mg total) by mouth 3 (three) times daily. (Patient not taking: Reported on 05/26/2020)  . [DISCONTINUED] traMADol (ULTRAM) 50 MG tablet Take 50 mg by mouth every 4 (four) hours as needed. For pain  (Patient not taking: Reported on 05/26/2020)   No facility-administered encounter medications on file as of 05/26/2020.    Follow-up: Return in about 3 months (around 08/26/2020).   Mckinley Jewel, MD

## 2020-05-31 ENCOUNTER — Telehealth: Payer: Self-pay

## 2020-05-31 LAB — CBC WITH DIFFERENTIAL/PLATELET
Absolute Monocytes: 481 cells/uL (ref 200–950)
Basophils Absolute: 20 cells/uL (ref 0–200)
Basophils Relative: 0.3 %
Eosinophils Absolute: 33 cells/uL (ref 15–500)
Eosinophils Relative: 0.5 %
HCT: 35.1 % (ref 35.0–45.0)
Hemoglobin: 11.2 g/dL — ABNORMAL LOW (ref 11.7–15.5)
Lymphs Abs: 2223 cells/uL (ref 850–3900)
MCH: 27.7 pg (ref 27.0–33.0)
MCHC: 31.9 g/dL — ABNORMAL LOW (ref 32.0–36.0)
MCV: 86.9 fL (ref 80.0–100.0)
MPV: 9.6 fL (ref 7.5–12.5)
Monocytes Relative: 7.4 %
Neutro Abs: 3744 cells/uL (ref 1500–7800)
Neutrophils Relative %: 57.6 %
Platelets: 341 10*3/uL (ref 140–400)
RBC: 4.04 10*6/uL (ref 3.80–5.10)
RDW: 13.6 % (ref 11.0–15.0)
Total Lymphocyte: 34.2 %
WBC: 6.5 10*3/uL (ref 3.8–10.8)

## 2020-05-31 LAB — LIPID PANEL
Cholesterol: 168 mg/dL (ref <200)
HDL: 68 mg/dL (ref >=50)
LDL Cholesterol (Calc): 77 mg/dL (calc)
Non-HDL Cholesterol (Calc): 100 mg/dL (calc) (ref <130)
Total CHOL/HDL Ratio: 2.5 (calc) (ref <5.0)
Triglycerides: 132 mg/dL (ref <150)

## 2020-05-31 LAB — URINALYSIS, ROUTINE W REFLEX MICROSCOPIC
Bacteria, UA: NONE SEEN /HPF
Bilirubin Urine: NEGATIVE
Glucose, UA: NEGATIVE
Hgb urine dipstick: NEGATIVE
Hyaline Cast: NONE SEEN /LPF
Ketones, ur: NEGATIVE
Leukocytes,Ua: NEGATIVE
Nitrite: NEGATIVE
Specific Gravity, Urine: 1.007 (ref 1.001–1.03)
pH: 6 (ref 5.0–8.0)

## 2020-05-31 LAB — ACUTE HEP PANEL AND HEP B SURFACE AB
HEPATITIS C ANTIBODY REFILL$(REFL): REACTIVE — AB
Hep A IgM: NONREACTIVE
Hep B C IgM: NONREACTIVE
Hepatitis B Surface Ag: NONREACTIVE
SIGNAL TO CUT-OFF: 32.2 — ABNORMAL HIGH (ref <1.00)

## 2020-05-31 LAB — RHEUMATOID FACTOR: Rheumatoid fact SerPl-aCnc: <14 IU/mL (ref <14)

## 2020-05-31 LAB — HEPATITIS C RNA QUANTITATIVE
HCV RNA, PCR, QN (Log): 5.69 log IU/mL — ABNORMAL HIGH
HCV RNA, PCR, QN: 485000 IU/mL — ABNORMAL HIGH

## 2020-05-31 LAB — VITAMIN D 25 HYDROXY (VIT D DEFICIENCY, FRACTURES): Vit D, 25-Hydroxy: 35 ng/mL (ref 30–100)

## 2020-05-31 LAB — VITAMIN B12: Vitamin B-12: 405 pg/mL (ref 200–1100)

## 2020-05-31 LAB — REFLEX TIQ

## 2020-05-31 LAB — HIV ANTIBODY (ROUTINE TESTING W REFLEX): HIV 1&2 Ab, 4th Generation: NONREACTIVE

## 2020-05-31 LAB — HEMOGLOBIN A1C W/OUT EAG: Hgb A1c MFr Bld: 5.5 % of total Hgb (ref <5.7)

## 2020-05-31 NOTE — Telephone Encounter (Signed)
Message left for patient making her aware of appt with Dr.Teoh on Friday

## 2020-05-31 NOTE — Telephone Encounter (Signed)
Pt requesting an antibiotic for ear drainage she stated it is green / thick and beginning to have an odor. Dr. Doristine Bosworth addressed this issue and sent in a referral to ENT.

## 2020-06-01 ENCOUNTER — Other Ambulatory Visit: Payer: Self-pay | Admitting: Internal Medicine

## 2020-06-01 DIAGNOSIS — B182 Chronic viral hepatitis C: Secondary | ICD-10-CM

## 2020-06-02 ENCOUNTER — Encounter (INDEPENDENT_AMBULATORY_CARE_PROVIDER_SITE_OTHER): Payer: Self-pay | Admitting: Gastroenterology

## 2020-07-06 ENCOUNTER — Encounter (INDEPENDENT_AMBULATORY_CARE_PROVIDER_SITE_OTHER): Payer: Medicare Other | Admitting: Gastroenterology

## 2021-04-10 ENCOUNTER — Telehealth: Payer: Self-pay | Admitting: Internal Medicine

## 2021-04-10 NOTE — Telephone Encounter (Signed)
Tried calling patient to schedule Medicare Annual Wellness Visit (AWV) either virtually or in office.  No answer   AWV-I PER PALMETTO 12/13/2015  please schedule at anytime with [facility]  health coach  This should be a 40 minute visit.

## 2021-07-12 ENCOUNTER — Telehealth: Payer: Self-pay | Admitting: Internal Medicine

## 2021-07-12 NOTE — Telephone Encounter (Signed)
  No answer unable to leave a message for patient to call back and schedule Medicare Annual Wellness Visit (AWV) in office.   If unable to come into the office for AWV,  please offer to do virtually or by telephone.  No hx of AWV eligible for AWVI as of 12/13/2015  Please schedule at anytime with [place].      40 Minutes appointment   Any questions, please call me at [phone]
